# Patient Record
Sex: Female | Born: 1960 | Race: White | Hispanic: No | Marital: Single | Smoking: Never smoker
Health system: Southern US, Community
[De-identification: ages and names within clinical notes are randomized; demographics above are authoritative.]

## PROBLEM LIST (undated history)

## (undated) HISTORY — PX: KNEE ARTHROSCOPY: SUR90

## (undated) HISTORY — PX: TONSILLECTOMY: SUR1361

---

## 2016-03-19 ENCOUNTER — Emergency Department (HOSPITAL_BASED_OUTPATIENT_CLINIC_OR_DEPARTMENT_OTHER): Payer: PRIVATE HEALTH INSURANCE

## 2016-03-19 ENCOUNTER — Emergency Department (HOSPITAL_BASED_OUTPATIENT_CLINIC_OR_DEPARTMENT_OTHER)
Admission: EM | Admit: 2016-03-19 | Discharge: 2016-03-19 | Disposition: A | Discharge status: Home / Self Care | Payer: PRIVATE HEALTH INSURANCE | Attending: Emergency Medicine | Admitting: Emergency Medicine

## 2016-03-19 ENCOUNTER — Encounter (HOSPITAL_BASED_OUTPATIENT_CLINIC_OR_DEPARTMENT_OTHER): Payer: Self-pay | Admitting: *Deleted

## 2016-03-19 DIAGNOSIS — Y998 Other external cause status: Secondary | ICD-10-CM | POA: Diagnosis not present

## 2016-03-19 DIAGNOSIS — S8392XA Sprain of unspecified site of left knee, initial encounter: Secondary | ICD-10-CM | POA: Diagnosis not present

## 2016-03-19 DIAGNOSIS — Y9389 Activity, other specified: Secondary | ICD-10-CM | POA: Insufficient documentation

## 2016-03-19 DIAGNOSIS — S93401A Sprain of unspecified ligament of right ankle, initial encounter: Secondary | ICD-10-CM | POA: Insufficient documentation

## 2016-03-19 DIAGNOSIS — Y9289 Other specified places as the place of occurrence of the external cause: Secondary | ICD-10-CM | POA: Insufficient documentation

## 2016-03-19 DIAGNOSIS — S99911A Unspecified injury of right ankle, initial encounter: Secondary | ICD-10-CM | POA: Diagnosis present

## 2016-03-19 DIAGNOSIS — W010XXA Fall on same level from slipping, tripping and stumbling without subsequent striking against object, initial encounter: Secondary | ICD-10-CM | POA: Insufficient documentation

## 2016-03-19 NOTE — Discharge Instructions (Signed)
Ankle Sprain °An ankle sprain is an injury to the strong, fibrous tissues (ligaments) that hold your ankle bones together.  °HOME CARE  °· Put ice on your ankle for 1-2 days or as told by your doctor. °¨ Put ice in a plastic bag. °¨ Place a towel between your skin and the bag. °¨ Leave the ice on for 15-20 minutes at a time, every 2 hours while you are awake. °· Only take medicine as told by your doctor. °· Raise (elevate) your injured ankle above the level of your heart as much as possible for 2-3 days. °· Use crutches if your doctor tells you to. Slowly put your own weight on the affected ankle. Use the crutches until you can walk without pain. °· If you have a plaster splint: °¨ Do not rest it on anything harder than a pillow for 24 hours. °¨ Do not put weight on it. °¨ Do not get it wet. °¨ Take it off to shower or bathe. °· If given, use an elastic wrap or support stocking for support. Take the wrap off if your toes lose feeling (numb), tingle, or turn cold or blue. °· If you have an air splint: °¨ Add or let out air to make it comfortable. °¨ Take it off at night and to shower and bathe. °¨ Wiggle your toes and move your ankle up and down often while you are wearing it. °GET HELP IF: °· You have rapidly increasing bruising or puffiness (swelling). °· Your toes feel very cold. °· You lose feeling in your foot. °· Your medicine does not help your pain. °GET HELP RIGHT AWAY IF:  °· Your toes lose feeling (numb) or turn blue. °· You have severe pain that is increasing. °MAKE SURE YOU:  °· Understand these instructions. °· Will watch your condition. °· Will get help right away if you are not doing well or get worse. °  °This information is not intended to replace advice given to you by your health care provider. Make sure you discuss any questions you have with your health care provider. °  °Document Released: 04/29/2008 Document Revised: 12/02/2014 Document Reviewed: 05/25/2012 °[employer]vier Interactive Patient  Education ©2016 [employer] ° °

## 2016-03-19 NOTE — ED Provider Notes (Signed)
CSN: [id number]     Arrival date & time 03/19/16  [id number] History   First MD Initiated Contact with Patient 03/19/16 [phone]     Chief Complaint  Patient presents with  . Ankle Injury     (Consider location/radiation/quality/duration/timing/severity/associated sxs/prior Treatment) Patient is a 55 y.o. female presenting with lower extremity injury. The history is provided by the patient.  Ankle Injury This is a new (Patient was walking along the sidewalk was covered in all leads causing her to slip and fall twisting her right ankle and her left knee) problem. The current episode started more than 1 week ago. The problem occurs constantly. The problem has not changed since onset.Associated symptoms comments: Left knee pain and swelling. Able to fully bend and extend the knee but pain with squatting and standing.. The symptoms are aggravated by walking. The symptoms are relieved by ice and rest. She has tried rest for the symptoms. The treatment provided mild relief.    History reviewed. No pertinent past medical history. Past Surgical History  Procedure Laterality Date  . Tonsillectomy    . Knee arthroscopy     No family history on file. Social History  Substance Use Topics  . Smoking status: Never Smoker   . Smokeless tobacco: None  . Alcohol Use: Yes     Comment: occassional wine   OB History    No data available     Review of Systems  All other systems reviewed and are negative.     Allergies  Review of patient's allergies indicates no known allergies.  Home Medications   Prior to Admission medications   Not on File   BP 171/94 mmHg  Pulse 82  Temp(Src) 98.3 F (36.8 C) (Oral)  Resp 20  Ht 5' 7.5" (1.715 m)  Wt 140 lb (63.504 kg)  BMI 21.59 kg/m2  SpO2 99% Physical Exam  Constitutional: She is oriented to person, place, and time. She appears well-developed and well-nourished. No distress.  HENT:  Head: Normocephalic and atraumatic.  Cardiovascular: Normal rate.    Pulmonary/Chest: Effort normal.  Musculoskeletal: She exhibits tenderness.       Left knee: She exhibits swelling. She exhibits normal range of motion, no LCL laxity and no MCL laxity. Tenderness found. Lateral joint line tenderness noted. No MCL and no LCL tenderness noted.       Right ankle: She exhibits swelling. She exhibits normal range of motion, no deformity and normal pulse. Tenderness. Lateral malleolus tenderness found.       Legs:      Feet:  Neurological: She is alert and oriented to person, place, and time.  Skin: Skin is warm and dry.  Psychiatric: She has a normal mood and affect. Her behavior is normal.  Nursing note and vitals reviewed.   ED Course  Procedures (including critical care time) Labs Review Labs Reviewed - No data to display  Imaging Review Dg Ankle Complete Right  03/19/2016  CLINICAL DATA:  Status post fall 1 week ago with ankle abrasion and pain. EXAM: RIGHT ANKLE - COMPLETE 3+ VIEW COMPARISON:  None. FINDINGS: There is no evidence of fracture, dislocation, or joint effusion. Soft tissues are unremarkable. IMPRESSION: No acute fracture or dislocation. Electronically Signed   By: Sherian ReinWei-Chen  Lin M.D.   On: 03/19/2016 10:34   Dg Knee Complete 4 Views Left  03/19/2016  CLINICAL DATA:  Status post fall 1 week ago with knee pain and bruising. EXAM: LEFT KNEE - COMPLETE 4+ VIEW COMPARISON:  None. FINDINGS:  There is no evidence of fracture, dislocation, or joint effusion. There is no evidence of arthropathy or other focal bone abnormality. Soft tissues are unremarkable. IMPRESSION: Negative. Electronically Signed   By: Sherian Rein M.D.   On: 03/19/2016 10:32   I have personally reviewed and evaluated these images and lab results as part of my medical decision-making.   EKG Interpretation None      MDM   Final diagnoses:  Ankle sprain, right, initial encounter  Knee sprain, left, initial encounter    Patient is a 55 year old female presenting today  with ongoing left knee and right ankle pain after a fall one week ago. No head injury, LOC or headache. Swelling and lateral malleolus tenderness on right ankle exam as well as mild swelling and tenderness over the left lateral meniscus. Full range of motion in both joints. Imaging pending  10:39 AM Imaging neg and pt dx with sprain.   Gwyneth Sprout, MD 03/19/16 1040

## 2016-03-19 NOTE — ED Notes (Signed)
Patient states she fell one week ago.  Twisted her right ankle and left knee. Denies loc.

## 2017-10-25 IMAGING — CR DG KNEE COMPLETE 4+V*L*
4 series · 4 of 4 positions shown · non-contrast
Comparison: None.

CLINICAL DATA: Status post fall 1 week ago with knee pain and
bruising.

EXAM:
LEFT KNEE - COMPLETE 4+ VIEW

[t knee ap left]
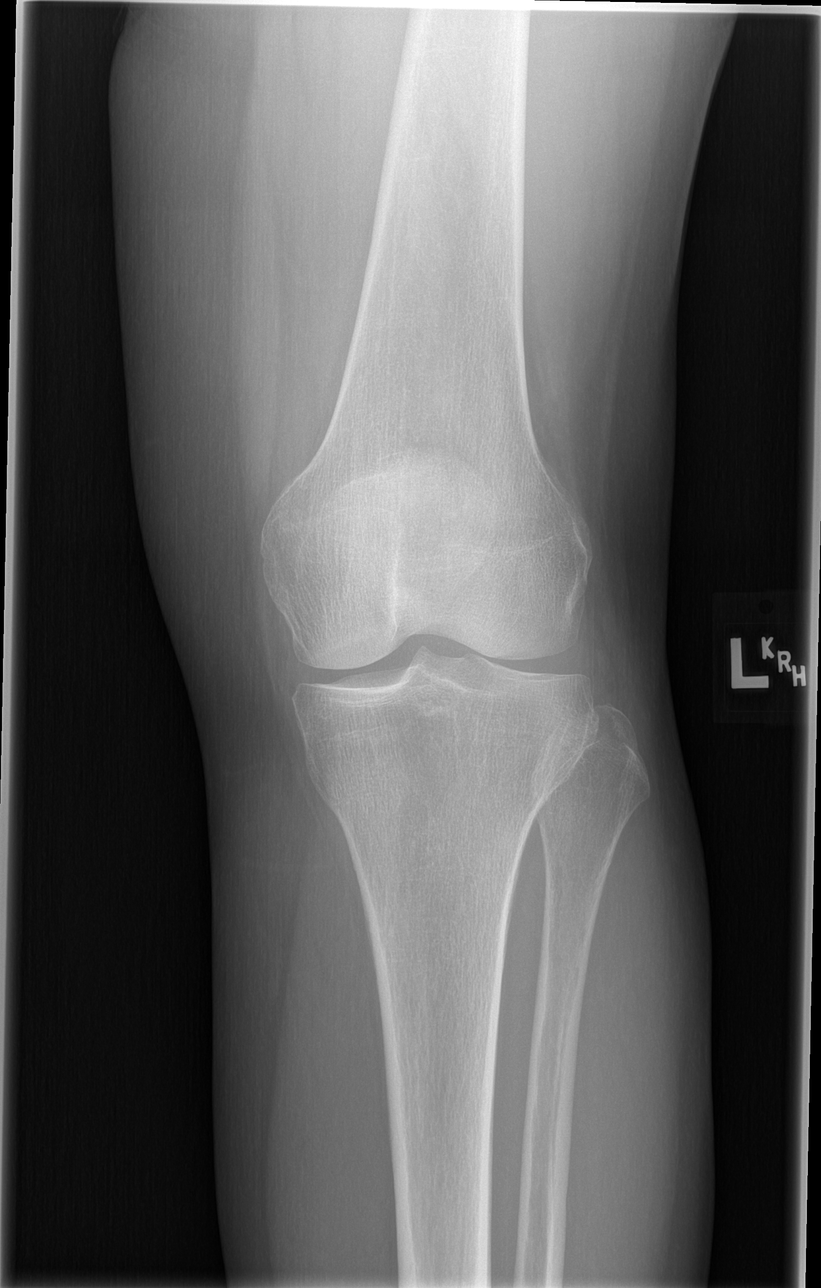

[t knee oblique left (1 of 2)]
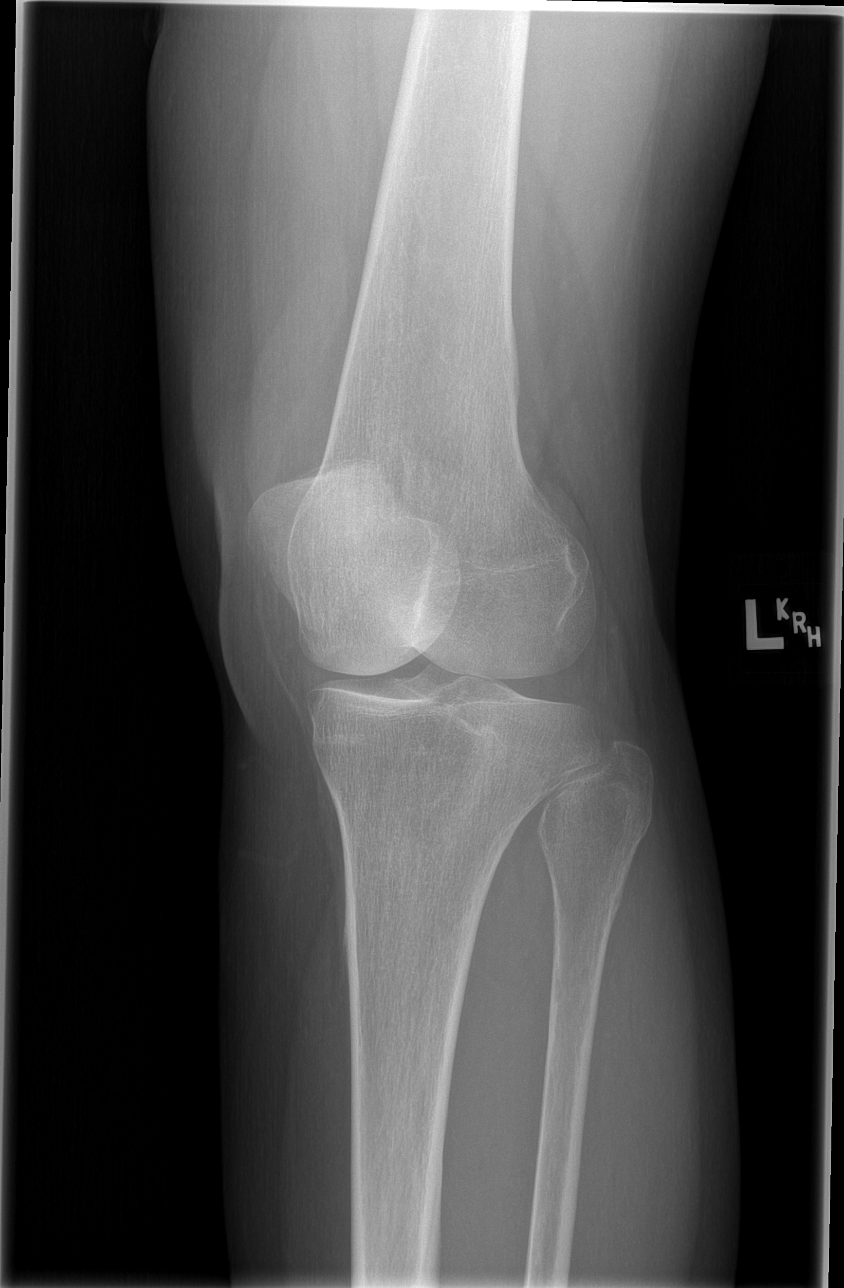

[t knee oblique left (2 of 2)]
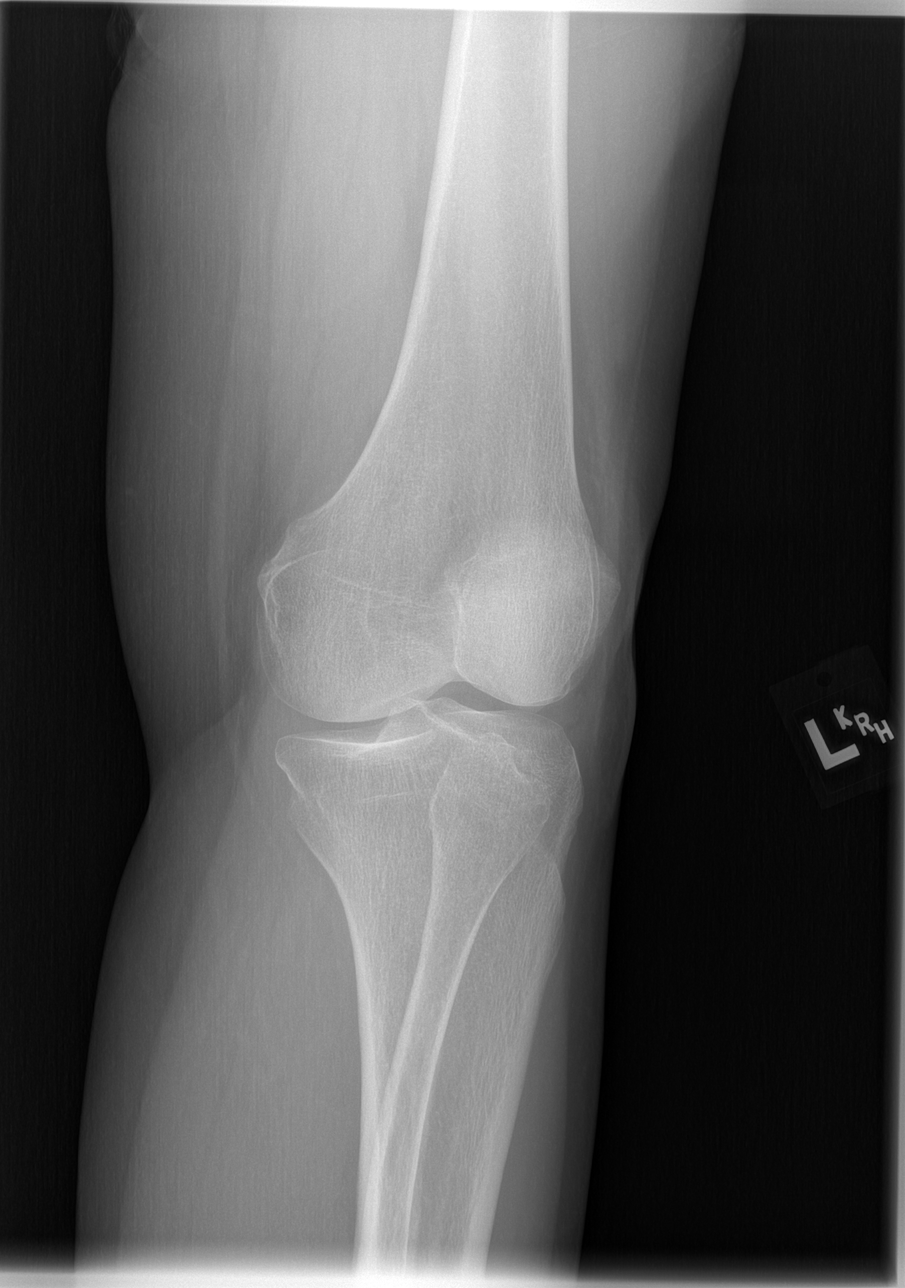

[t knee lat left]
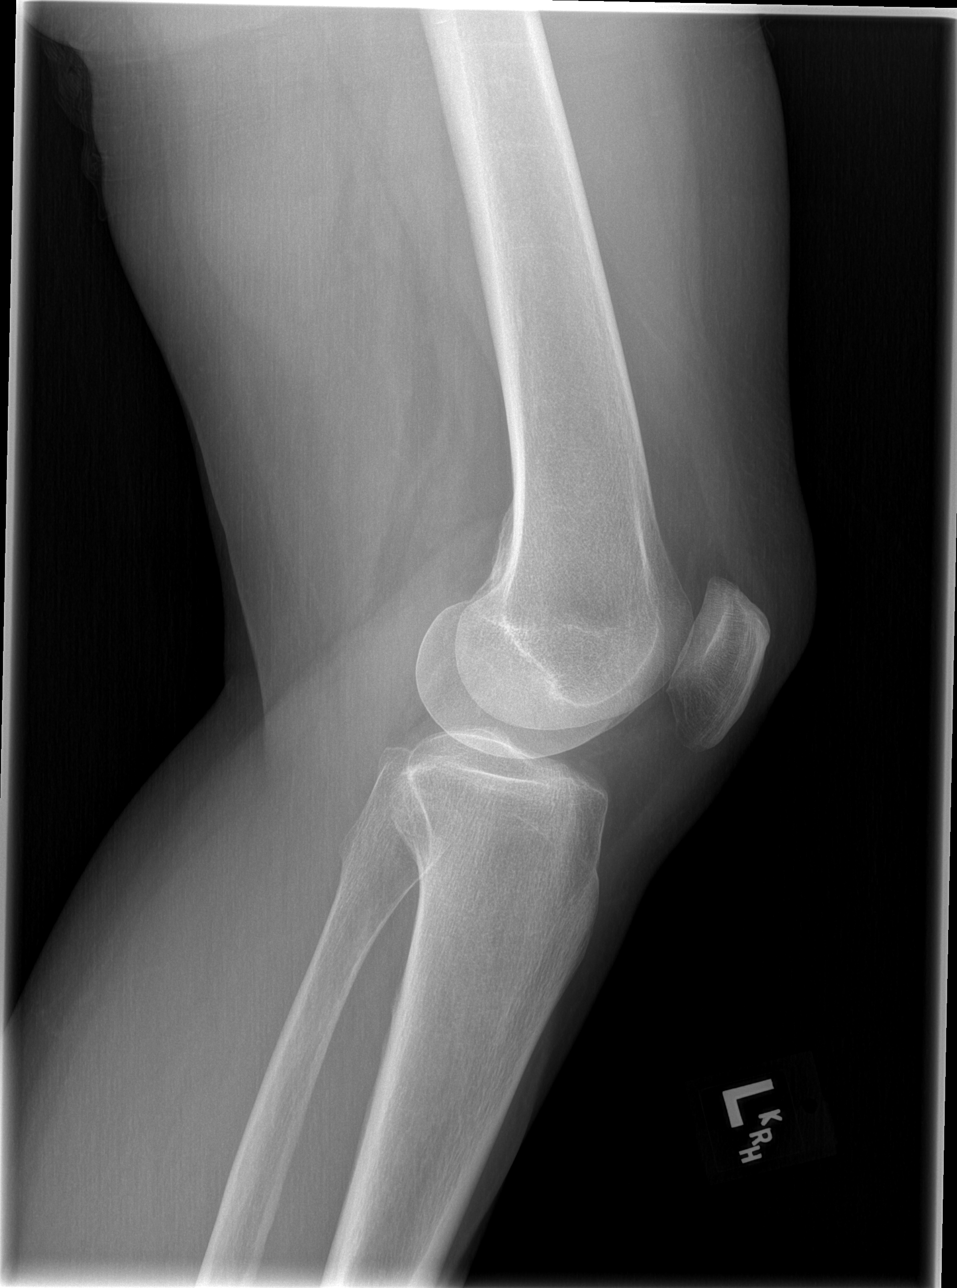

[4 of 4 positions shown; findings below may reference images not displayed]

FINDINGS: There is no evidence of fracture, dislocation, or joint effusion.
There is no evidence of arthropathy or other focal bone abnormality.
Soft tissues are unremarkable.
IMPRESSION: Negative.
